# Patient Record
Sex: Female | Born: 1985 | Hispanic: Yes | Marital: Single | Smoking: Never smoker
Health system: Southern US, Community
[De-identification: ages and names within clinical notes are randomized; demographics above are authoritative.]

---

## 2010-10-01 ENCOUNTER — Emergency Department: Payer: Self-pay | Admitting: Emergency Medicine

## 2011-08-22 ENCOUNTER — Ambulatory Visit: Payer: Self-pay | Admitting: Family Medicine

## 2015-03-08 LAB — OB RESULTS CONSOLE VARICELLA ZOSTER ANTIBODY, IGG: Varicella: IMMUNE

## 2015-09-08 LAB — OB RESULTS CONSOLE RUBELLA ANTIBODY, IGM: Rubella: IMMUNE

## 2015-09-08 LAB — OB RESULTS CONSOLE ANTIBODY SCREEN: ANTIBODY SCREEN: NEGATIVE

## 2015-09-08 LAB — OB RESULTS CONSOLE RPR: RPR: NONREACTIVE

## 2015-09-08 LAB — OB RESULTS CONSOLE ABO/RH: RH Type: POSITIVE

## 2015-09-08 LAB — OB RESULTS CONSOLE HEPATITIS B SURFACE ANTIGEN: HEP B S AG: NEGATIVE

## 2015-09-08 LAB — OB RESULTS CONSOLE HIV ANTIBODY (ROUTINE TESTING): HIV: NONREACTIVE

## 2015-09-08 LAB — OB RESULTS CONSOLE GC/CHLAMYDIA
Chlamydia: NEGATIVE
GC PROBE AMP, GENITAL: NEGATIVE

## 2015-10-13 ENCOUNTER — Other Ambulatory Visit: Payer: Self-pay | Admitting: Family Medicine

## 2015-10-13 DIAGNOSIS — Z3492 Encounter for supervision of normal pregnancy, unspecified, second trimester: Secondary | ICD-10-CM

## 2015-10-19 ENCOUNTER — Ambulatory Visit
Admission: RE | Admit: 2015-10-19 | Discharge: 2015-10-19 | Disposition: A | Discharge status: Home / Self Care | Payer: Medicaid Other | Source: Ambulatory Visit | Attending: Family Medicine | Admitting: Family Medicine

## 2015-10-19 DIAGNOSIS — Z3A21 21 weeks gestation of pregnancy: Secondary | ICD-10-CM | POA: Insufficient documentation

## 2015-10-19 DIAGNOSIS — Z36 Encounter for antenatal screening of mother: Secondary | ICD-10-CM | POA: Diagnosis not present

## 2015-10-19 DIAGNOSIS — Z3492 Encounter for supervision of normal pregnancy, unspecified, second trimester: Secondary | ICD-10-CM

## 2016-02-04 LAB — OB RESULTS CONSOLE GBS: STREP GROUP B AG: NEGATIVE

## 2016-02-21 ENCOUNTER — Other Ambulatory Visit: Payer: Self-pay | Admitting: Obstetrics and Gynecology

## 2016-02-21 NOTE — H&P (Signed)
HISTORY AND PHYSICAL  HISTORY OF PRESENT ILLNESS: Ms. Ariel Allen is a Gravida 1 P0 with  LMP of 05/24/15 and EDD of 02/28/16  And 20 4/7  week ultrasound with EDD of 03/03/16 and  a pregnancy complicated by anemia, urinary frequency, mold renal disease ruled out in 2013 presenting for induction of labor.   She has not been having contractions  and denies leakage of fluid, vaginal bleeding, or decreased fetal movement.    REVIEW OF SYSTEMS: A complete review of systems was performed and was specifically negative for headache, changes in vision, RUQ pain, shortness of breath, chest pain, lower extremity edema and dysuria.   HISTORY:  No past medical history on file.  No past surgical history on file.  No current outpatient prescriptions on file prior to visit.   No current facility-administered medications on file prior to visit.     Allergies not on file  OB History  No data available    Gynecologic History: History of Abnormal Pap Smear:  History of STI:  Social History  Substance Use Topics  . Smoking status: Not on file  . Smokeless tobacco: Not on file  . Alcohol Use: Not on file    PHYSICAL EXAM: @VSRANGES @  GENERAL: NAD AAOx3 CHEST:CTAB no increased work of breathing CV:RRR no appreciable murmurs, rubs, gallops ABDOMEN: gravid, nontender, EFWg by [place] EXTREMITIES:  Warm and well-perfused, nontender, nonedematous, DTRsclonus CERVIX: SPECULUM:   FHT:s baseline withvariability  accelerations and decelerations  Toco:   DIAGNOSTIC STUDIES: No results for input(s): WBC, HGB, HCT, PLT, NA, K, CL, CO2, BUN, CREATININE, LABGLOM, GLUCOSE, CALCIUM, BILIDIR, ALKPHOS, AST, ALT, PROT, MG in the last 168 hours.  Invalid input(s): LABALB, UA  PRENATAL STUDIES:  Prenatal Labs:  MBT: O pos, ; Rubella immune, Varicella immune, HIV neg, RPR neg, Hep B neg, GC/CT neg, GBS neg, glucola 121  Last US placenta above the os, AF wnl, normal anatomy  ASSESSMENT  AND PLAN:  1. Fetal Well being  - Fetal Tracing: - Ultrasound: reviewed, as above - Group B Streptococcus: Neg - Presentation:  VTX confirmed by   2. Routine OB: - Prenatal labs reviewed, as above - Rh  O pos  3. Induction of Labor:  -  Contractions external toco in place -  Pelvis proven to -  Plan for induction with  Pitocin per protocol  4. Post Partum Planning: - Infant feeding:  - Contraception:

## 2016-03-06 ENCOUNTER — Inpatient Hospital Stay
Admission: RE | Admit: 2016-03-06 | Discharge: 2016-03-09 | DRG: 766 | Disposition: A | Discharge status: Home / Self Care | Payer: Medicaid Other | Attending: Obstetrics and Gynecology | Admitting: Obstetrics and Gynecology

## 2016-03-06 DIAGNOSIS — Z6836 Body mass index (BMI) 36.0-36.9, adult: Secondary | ICD-10-CM

## 2016-03-06 DIAGNOSIS — O480 Post-term pregnancy: Secondary | ICD-10-CM | POA: Diagnosis present

## 2016-03-06 DIAGNOSIS — Z3A41 41 weeks gestation of pregnancy: Secondary | ICD-10-CM | POA: Diagnosis not present

## 2016-03-06 DIAGNOSIS — O9902 Anemia complicating childbirth: Secondary | ICD-10-CM | POA: Diagnosis present

## 2016-03-06 DIAGNOSIS — O99214 Obesity complicating childbirth: Secondary | ICD-10-CM | POA: Diagnosis present

## 2016-03-06 LAB — COMPREHENSIVE METABOLIC PANEL
ALK PHOS: 161 U/L — AB (ref 38–126)
ALT: 12 U/L — AB (ref 14–54)
AST: 19 U/L (ref 15–41)
Albumin: 3 g/dL — ABNORMAL LOW (ref 3.5–5.0)
Anion gap: 7 (ref 5–15)
BUN: 19 mg/dL (ref 6–20)
CALCIUM: 8.9 mg/dL (ref 8.9–10.3)
CHLORIDE: 108 mmol/L (ref 101–111)
CO2: 18 mmol/L — AB (ref 22–32)
CREATININE: 0.94 mg/dL (ref 0.44–1.00)
GFR calc Af Amer: >60 mL/min (ref >60)
GFR calc non Af Amer: >60 mL/min (ref >60)
GLUCOSE: 88 mg/dL (ref 65–99)
Potassium: 4.3 mmol/L (ref 3.5–5.1)
SODIUM: 133 mmol/L — AB (ref 135–145)
Total Bilirubin: 0.2 mg/dL — ABNORMAL LOW (ref 0.3–1.2)
Total Protein: 6.6 g/dL (ref 6.5–8.1)

## 2016-03-06 LAB — TYPE AND SCREEN
ABO/RH(D): O POS
Antibody Screen: NEGATIVE

## 2016-03-06 LAB — CBC WITH DIFFERENTIAL/PLATELET
BASOS ABS: 0 10*3/uL (ref 0–0.1)
Basophils Relative: 0 %
EOS ABS: 0.1 10*3/uL (ref 0–0.7)
HCT: 34.4 % — ABNORMAL LOW (ref 35.0–47.0)
HEMOGLOBIN: 11.6 g/dL — AB (ref 12.0–16.0)
LYMPHS ABS: 1.1 10*3/uL (ref 1.0–3.6)
MCH: 29.6 pg (ref 26.0–34.0)
MCHC: 33.9 g/dL (ref 32.0–36.0)
MCV: 87.5 fL (ref 80.0–100.0)
Monocytes Absolute: 0.9 10*3/uL (ref 0.2–0.9)
Monocytes Relative: 11 %
Neutro Abs: 5.7 10*3/uL (ref 1.4–6.5)
PLATELETS: 200 10*3/uL (ref 150–440)
RBC: 3.93 MIL/uL (ref 3.80–5.20)
RDW: 14.6 % — ABNORMAL HIGH (ref 11.5–14.5)
WBC: 7.8 10*3/uL (ref 3.6–11.0)

## 2016-03-06 LAB — PROTEIN / CREATININE RATIO, URINE
Creatinine, Urine: 35 mg/dL
Protein Creatinine Ratio: 0.31 mg/mg{Cre} — ABNORMAL HIGH (ref 0.00–0.15)
Total Protein, Urine: 11 mg/dL

## 2016-03-06 LAB — URIC ACID: Uric Acid, Serum: 7.1 mg/dL — ABNORMAL HIGH (ref 2.3–6.6)

## 2016-03-06 LAB — ABO/RH: ABO/RH(D): O POS

## 2016-03-06 MED ORDER — LIDOCAINE HCL (PF) 1 % IJ SOLN: 30.0000 mL | INTRAMUSCULAR | Status: DC | PRN

## 2016-03-06 MED ORDER — OXYCODONE-ACETAMINOPHEN 5-325 MG PO TABS: 1.0000 | ORAL_TABLET | ORAL | Status: DC | PRN

## 2016-03-06 MED ORDER — MISOPROSTOL 25 MCG QUARTER TABLET
25.0000 ug | ORAL_TABLET | ORAL | Status: DC
  Administered 2016-03-06 (×2): 25 ug via VAGINAL
  Filled 2016-03-06 (×8): qty 1

## 2016-03-06 MED ORDER — LACTATED RINGERS IV SOLN
500.0000 mL | INTRAVENOUS | Status: DC | PRN
  Administered 2016-03-06: 1000 mL via INTRAVENOUS

## 2016-03-06 MED ORDER — LACTATED RINGERS IV SOLN
INTRAVENOUS | Status: DC
  Administered 2016-03-06 – 2016-03-07 (×4): via INTRAVENOUS

## 2016-03-06 MED ORDER — LACTATED RINGERS IV SOLN: 500.0000 mL | INTRAVENOUS | Status: DC | PRN

## 2016-03-06 MED ORDER — OXYTOCIN 40 UNITS IN LACTATED RINGERS INFUSION - SIMPLE MED: 1.0000 m[IU]/min | INTRAVENOUS | Status: DC

## 2016-03-06 MED ORDER — OXYTOCIN 40 UNITS IN LACTATED RINGERS INFUSION - SIMPLE MED: 2.5000 [IU]/h | INTRAVENOUS | Status: DC

## 2016-03-06 MED ORDER — CITRIC ACID-SODIUM CITRATE 334-500 MG/5ML PO SOLN
30.0000 mL | ORAL | Status: DC | PRN
  Administered 2016-03-07: 30 mL via ORAL
  Filled 2016-03-06: qty 15

## 2016-03-06 MED ORDER — ONDANSETRON HCL 4 MG/2ML IJ SOLN: 4.0000 mg | Freq: Four times a day (QID) | INTRAMUSCULAR | Status: DC | PRN

## 2016-03-06 MED ORDER — TERBUTALINE SULFATE 1 MG/ML IJ SOLN: 0.2500 mg | Freq: Once | INTRAMUSCULAR | Status: DC | PRN

## 2016-03-06 MED ORDER — OXYTOCIN BOLUS FROM INFUSION: 500.0000 mL | INTRAVENOUS | Status: DC

## 2016-03-06 MED ORDER — ACETAMINOPHEN 325 MG PO TABS: 650.0000 mg | ORAL_TABLET | ORAL | Status: DC | PRN

## 2016-03-06 MED ORDER — BUTORPHANOL TARTRATE 1 MG/ML IJ SOLN: 1.0000 mg | INTRAMUSCULAR | Status: DC | PRN

## 2016-03-06 MED ORDER — OXYCODONE-ACETAMINOPHEN 5-325 MG PO TABS: 2.0000 | ORAL_TABLET | ORAL | Status: DC | PRN

## 2016-03-06 MED ORDER — LIDOCAINE HCL (PF) 1 % IJ SOLN
30.0000 mL | INTRAMUSCULAR | Status: DC | PRN
  Filled 2016-03-06: qty 30

## 2016-03-06 MED ORDER — LACTATED RINGERS IV SOLN: INTRAVENOUS | Status: DC

## 2016-03-06 MED ORDER — OXYTOCIN 40 UNITS IN LACTATED RINGERS INFUSION - SIMPLE MED
2.5000 [IU]/h | INTRAVENOUS | Status: DC
  Administered 2016-03-07: 2.5 [IU]/h via INTRAVENOUS
  Filled 2016-03-06 (×2): qty 1000

## 2016-03-06 MED ORDER — CITRIC ACID-SODIUM CITRATE 334-500 MG/5ML PO SOLN: 30.0000 mL | ORAL | Status: DC | PRN

## 2016-03-06 NOTE — Progress Notes (Addendum)
S:  Feeling more contractions - but tolerating them      Cytotec placed at 1027   O:  VS: Blood pressure 118/76, pulse 75, temperature 98.3 F (36.8 C), temperature source Oral, resp. rate 18, height  (1.499 m), weight 81.647 kg (180 lb), last menstrual period 05/24/2015.        FHR : baseline 135 bpm / variability minimal-moderate/ accelerations + / occasional variable decelerations        Toco: contractions every 5-57minutes / mild        Cervix : Dilation: 1 Effacement (%): 50 Cervical Position: Posterior Station: -3 Exam by:: Joelene Millin, CNM        Membranes:Intact Results for orders placed or performed during the hospital encounter of 03/06/16 (from the past 24 hour(s))  Type and screen     Status: None   Collection Time: 03/06/16  9:52 AM  Result Value Ref Range   ABO/RH(D) O POS    Antibody Screen NEG    Sample Expiration 03/09/2016   Protein / creatinine ratio, urine     Status: Abnormal   Collection Time: 03/06/16  9:53 AM  Result Value Ref Range   Creatinine, Urine 35 mg/dL   Total Protein, Urine 11 mg/dL   Protein Creatinine Ratio 0.31 (H) 0.00 - 0.15 mg/mg[Cre]  ABO/Rh     Status: None   Collection Time: 03/06/16  9:53 AM  Result Value Ref Range   ABO/RH(D) O POS   CBC with Differential/Platelet     Status: Abnormal   Collection Time: 03/06/16  9:54 AM  Result Value Ref Range   WBC 7.8 3.6 - 11.0 K/uL   RBC 3.93 3.80 - 5.20 MIL/uL   Hemoglobin 11.6 (L) 12.0 - 16.0 g/dL   HCT 16.1 (L) 09.6 - 04.5 %   MCV 87.5 80.0 - 100.0 fL   MCH 29.6 26.0 - 34.0 pg   MCHC 33.9 32.0 - 36.0 g/dL   RDW 40.9 (H) 81.1 - 91.4 %   Platelets 200 150 - 440 K/uL   Neutrophils Relative % 74% %   Neutro Abs 5.7 1.4 - 6.5 K/uL   Lymphocytes Relative 14% %   Lymphs Abs 1.1 1.0 - 3.6 K/uL   Monocytes Relative 11% %   Monocytes Absolute 0.9 0.2 - 0.9 K/uL   Eosinophils Relative 1% %   Eosinophils Absolute 0.1 0 - 0.7 K/uL   Basophils Relative 0% %   Basophils Absolute 0.0 0 - 0.1  K/uL  Comprehensive metabolic panel     Status: Abnormal   Collection Time: 03/06/16  9:54 AM  Result Value Ref Range   Sodium 133 (L) 135 - 145 mmol/L   Potassium 4.3 3.5 - 5.1 mmol/L   Chloride 108 101 - 111 mmol/L   CO2 18 (L) 22 - 32 mmol/L   Glucose, Bld 88 65 - 99 mg/dL   BUN 19 6 - 20 mg/dL   Creatinine, Ser 7.82 0.44 - 1.00 mg/dL   Calcium 8.9 8.9 - 95.6 mg/dL   Total Protein 6.6 6.5 - 8.1 g/dL   Albumin 3.0 (L) 3.5 - 5.0 g/dL   AST 19 15 - 41 U/L   ALT 12 (L) 14 - 54 U/L   Alkaline Phosphatase 161 (H) 38 - 126 U/L   Total Bilirubin 0.2 (L) 0.3 - 1.2 mg/dL   GFR calc non Af Amer >60 >60 mL/min   GFR calc Af Amer >60 >60 mL/min   Anion gap 7 5 -  15  Uric acid     Status: Abnormal   Collection Time: 03/06/16  9:54 AM  Result Value Ref Range   Uric Acid, Serum 7.1 (H) 2.3 - 6.6 mg/dL   A: Latent labor     FHR category 2  P: Recheck cervix at 1427 to evaluate for management      Elevated protein-creatinine ratio and uric acid - normal liver enzymes, platelets - will continue to monitor blood pressures for pre-eclampsia      Anticipate NSVD  Ariel Allen, CNM

## 2016-03-06 NOTE — H&P (Signed)
HISTORY AND PHYSICAL  HISTORY OF PRESENT ILLNESS: Ms. 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   She has not been having contractions and denies leakage of fluid, vaginal bleeding, or decreased fetal movement.    REVIEW OF SYSTEMS: A complete review of systems was performed and was specifically POSITIVE for headache, negative for changes in vision, RUQ pain, shortness of breath, chest pain, lower extremity edema and dysuria.   HISTORY:  No past medical history on file.  No past surgical history on file.  No current outpatient prescriptions on file prior to visit.   No current facility-administered medications on file prior to visit.    Allergies not on file  OB History  No data available    Gynecologic History:   Social History  Substance Use Topics  . Smoking status: Not on file  . Smokeless tobacco: Not on file  . Alcohol Use: Not on file    PHYSICAL EXAM: Blood pressure 130/90, pulse 81, temperature 98.3 F (36.8 C), temperature source Oral, resp. rate 18, height 4\' 11"  (1.499 m), weight 81.647 kg (180 lb), last menstrual period 05/24/2015. GENERAL: NAD AAOx3 CHEST:CTAB no increased work of breathing CV:RRR no appreciable murmurs, rubs, gallops ABDOMEN: gravid, nontender, EXTREMITIES: Warm and well-perfused, nontender, nonedematous, normal DTRs no clonus CERVIX: Dilation: 1 Effacement (%): 50 Cervical Position: Posterior Station: -3 Exam by:: Joelene MillinM. Sigmon, CNM   FHTs: baseline: 140 bmp/ Minimal -Moderate variability/ +accels after cervical exam/ 2 late decelerations on admission   Toco: occasional UC  DIAGNOSTIC STUDIES:  Last Labs     No results for input(s): WBC, HGB, HCT, PLT, NA, K, CL, CO2, BUN, CREATININE,  LABGLOM, GLUCOSE, CALCIUM, BILIDIR, ALKPHOS, AST, ALT, PROT, MG in the last 168 hours.  Invalid input(s): LABALB, UA    PRENATAL STUDIES:  Prenatal Labs:  MBT: O pos, ; Rubella immune, Varicella immune, HIV neg, RPR neg, Hep B neg, GC/CT neg, GBS neg, glucola 121  Last US placenta above the os, AF wnl, normal anatomy  ASSESSMENT AND PLAN:  1. Fetal Well being  - Fetal Tracing: Category 2 with progression to Category 1 - Continuous fetal monitoring  - Group B Streptococcus: Neg - Presentation: VTX confirmed by CNM  2. Routine OB: - Prenatal labs reviewed, as above - Rh O pos   3. Induction of Labor:  - Contractions external toco in place - Plan for induction withCytotec 25mcg x 1  4. Post Partum Planning: - Infant feeding: Breast - Contraception: unknown  5. Elevated BPs and Headache: - Pre-eclampsia labs drawn for elevated BPs and Headache -Continue blood pressure monitoring every hour      Dr. Dalbert GarnetBeasley notified of admission and aware of plan of care  Carlean JewsMeredith Sigmon, CNM

## 2016-03-07 ENCOUNTER — Encounter: Payer: Self-pay | Admitting: Anesthesiology

## 2016-03-07 ENCOUNTER — Inpatient Hospital Stay: Payer: Medicaid Other | Admitting: Anesthesiology

## 2016-03-07 ENCOUNTER — Encounter
Admission: RE | Disposition: A | Discharge status: Home / Self Care | Payer: Self-pay | Source: Home / Self Care | Attending: Obstetrics and Gynecology

## 2016-03-07 LAB — RPR: RPR Ser Ql: NONREACTIVE

## 2016-03-07 SURGERY — Surgical Case
Anesthesia: Epidural

## 2016-03-07 MED ORDER — IBUPROFEN 600 MG PO TABS
600.0000 mg | ORAL_TABLET | Freq: Four times a day (QID) | ORAL | Status: DC
Start: 2016-03-07 — End: 2016-03-09
  Administered 2016-03-08 – 2016-03-09 (×3): 600 mg via ORAL
  Filled 2016-03-07 (×3): qty 1

## 2016-03-07 MED ORDER — PRENATAL MULTIVITAMIN CH
1.0000 | ORAL_TABLET | Freq: Every day | ORAL | Status: DC
  Administered 2016-03-08: 1 via ORAL
  Filled 2016-03-07: qty 1

## 2016-03-07 MED ORDER — DIPHENHYDRAMINE HCL 50 MG/ML IJ SOLN: 12.5000 mg | INTRAMUSCULAR | Status: DC | PRN

## 2016-03-07 MED ORDER — SIMETHICONE 80 MG PO CHEW
80.0000 mg | CHEWABLE_TABLET | ORAL | Status: DC | PRN
Start: 2016-03-07 — End: 2016-03-09

## 2016-03-07 MED ORDER — FLEET ENEMA 7-19 GM/118ML RE ENEM: 1.0000 | ENEMA | Freq: Every day | RECTAL | Status: DC | PRN

## 2016-03-07 MED ORDER — NALOXONE HCL 0.4 MG/ML IJ SOLN: 0.4000 mg | INTRAMUSCULAR | Status: DC | PRN

## 2016-03-07 MED ORDER — DIPHENHYDRAMINE HCL 25 MG PO CAPS: 25.0000 mg | ORAL_CAPSULE | Freq: Four times a day (QID) | ORAL | Status: DC | PRN

## 2016-03-07 MED ORDER — LIDOCAINE-EPINEPHRINE (PF) 1.5 %-1:200000 IJ SOLN
INTRAMUSCULAR | Status: DC | PRN
  Administered 2016-03-07: 3 mL via EPIDURAL

## 2016-03-07 MED ORDER — BUPIVACAINE HCL (PF) 0.25 % IJ SOLN
INTRAMUSCULAR | Status: DC | PRN
  Administered 2016-03-07: 5 mL via EPIDURAL

## 2016-03-07 MED ORDER — OXYCODONE HCL 5 MG PO TABS
5.0000 mg | ORAL_TABLET | Freq: Once | ORAL | Status: AC | PRN
  Administered 2016-03-07: 5 mg via ORAL

## 2016-03-07 MED ORDER — KETOROLAC TROMETHAMINE 30 MG/ML IJ SOLN: 30.0000 mg | Freq: Four times a day (QID) | INTRAMUSCULAR | Status: AC | PRN

## 2016-03-07 MED ORDER — BUPIVACAINE HCL (PF) 0.5 % IJ SOLN
INTRAMUSCULAR | Status: DC | PRN
  Administered 2016-03-07: 10 mL

## 2016-03-07 MED ORDER — DIBUCAINE 1 % RE OINT: 1.0000 "application " | TOPICAL_OINTMENT | RECTAL | Status: DC | PRN

## 2016-03-07 MED ORDER — MEASLES, MUMPS & RUBELLA VAC ~~LOC~~ INJ
0.5000 mL | INJECTION | Freq: Once | SUBCUTANEOUS | Status: DC
  Filled 2016-03-07: qty 0.5

## 2016-03-07 MED ORDER — BUPIVACAINE HCL (PF) 0.5 % IJ SOLN
INTRAMUSCULAR | Status: AC
  Filled 2016-03-07: qty 30

## 2016-03-07 MED ORDER — SIMETHICONE 80 MG PO CHEW
80.0000 mg | CHEWABLE_TABLET | Freq: Three times a day (TID) | ORAL | Status: DC
  Administered 2016-03-07 – 2016-03-09 (×4): 80 mg via ORAL
  Filled 2016-03-07 (×3): qty 1

## 2016-03-07 MED ORDER — FENTANYL CITRATE (PF) 100 MCG/2ML IJ SOLN
INTRAMUSCULAR | Status: DC | PRN
  Administered 2016-03-07: 50 ug via INTRAVENOUS
  Administered 2016-03-07 (×2): 100 ug via INTRAVENOUS

## 2016-03-07 MED ORDER — NALOXONE HCL 2 MG/2ML IJ SOSY
1.0000 ug/kg/h | PREFILLED_SYRINGE | INTRAMUSCULAR | Status: DC | PRN
  Filled 2016-03-07: qty 2

## 2016-03-07 MED ORDER — CITRIC ACID-SODIUM CITRATE 334-500 MG/5ML PO SOLN: 30.0000 mL | ORAL | Status: DC

## 2016-03-07 MED ORDER — MEPERIDINE HCL 25 MG/ML IJ SOLN: 6.2500 mg | INTRAMUSCULAR | Status: DC | PRN

## 2016-03-07 MED ORDER — OXYCODONE HCL 5 MG/5ML PO SOLN
5.0000 mg | Freq: Once | ORAL | Status: AC | PRN
  Filled 2016-03-07: qty 5

## 2016-03-07 MED ORDER — TERBUTALINE SULFATE 1 MG/ML IJ SOLN: 0.2500 mg | Freq: Once | INTRAMUSCULAR | Status: DC | PRN

## 2016-03-07 MED ORDER — COCONUT OIL OIL
1.0000 | TOPICAL_OIL | Status: DC | PRN
Start: 2016-03-07 — End: 2016-03-09

## 2016-03-07 MED ORDER — CEFAZOLIN SODIUM-DEXTROSE 2-4 GM/100ML-% IV SOLN
2.0000 g | Freq: Once | INTRAVENOUS | Status: AC
  Administered 2016-03-07: 2 g via INTRAVENOUS

## 2016-03-07 MED ORDER — KETOROLAC TROMETHAMINE 30 MG/ML IJ SOLN
30.0000 mg | Freq: Four times a day (QID) | INTRAMUSCULAR | Status: AC | PRN
  Administered 2016-03-07 – 2016-03-08 (×4): 30 mg via INTRAVENOUS
  Filled 2016-03-07 (×4): qty 1

## 2016-03-07 MED ORDER — LIDOCAINE HCL (PF) 1 % IJ SOLN
INTRAMUSCULAR | Status: DC | PRN
  Administered 2016-03-07: 1 mL via INTRADERMAL

## 2016-03-07 MED ORDER — SENNOSIDES-DOCUSATE SODIUM 8.6-50 MG PO TABS
2.0000 | ORAL_TABLET | ORAL | Status: DC
  Administered 2016-03-09: 2 via ORAL
  Filled 2016-03-07: qty 2

## 2016-03-07 MED ORDER — LACTATED RINGERS IV SOLN
INTRAVENOUS | Status: DC
  Administered 2016-03-07: via INTRAVENOUS

## 2016-03-07 MED ORDER — NALBUPHINE HCL 10 MG/ML IJ SOLN: 5.0000 mg | INTRAMUSCULAR | Status: DC | PRN

## 2016-03-07 MED ORDER — LIDOCAINE HCL (PF) 2 % IJ SOLN
INTRAMUSCULAR | Status: DC | PRN
  Administered 2016-03-07: 100 mg via EPIDURAL
  Administered 2016-03-07 (×2): 200 mg via INTRADERMAL
  Administered 2016-03-07 (×3): 100 mg via EPIDURAL

## 2016-03-07 MED ORDER — BISACODYL 10 MG RE SUPP: 10.0000 mg | Freq: Every day | RECTAL | Status: DC | PRN

## 2016-03-07 MED ORDER — SODIUM CHLORIDE 0.9% FLUSH: 3.0000 mL | INTRAVENOUS | Status: DC | PRN

## 2016-03-07 MED ORDER — OXYTOCIN 40 UNITS IN LACTATED RINGERS INFUSION - SIMPLE MED: 1.0000 m[IU]/min | INTRAVENOUS | Status: DC

## 2016-03-07 MED ORDER — NALBUPHINE HCL 10 MG/ML IJ SOLN: 5.0000 mg | Freq: Once | INTRAMUSCULAR | Status: DC | PRN

## 2016-03-07 MED ORDER — SIMETHICONE 80 MG PO CHEW
80.0000 mg | CHEWABLE_TABLET | ORAL | Status: DC
Start: 2016-03-08 — End: 2016-03-09

## 2016-03-07 MED ORDER — ACETAMINOPHEN 325 MG PO TABS: 650.0000 mg | ORAL_TABLET | ORAL | Status: DC | PRN

## 2016-03-07 MED ORDER — OXYTOCIN 40 UNITS IN LACTATED RINGERS INFUSION - SIMPLE MED
2.5000 [IU]/h | INTRAVENOUS | Status: AC
  Filled 2016-03-07: qty 1000

## 2016-03-07 MED ORDER — FENTANYL 2.5 MCG/ML W/ROPIVACAINE 0.2% IN NS 100 ML EPIDURAL INFUSION (ARMC-ANES)
EPIDURAL | Status: AC
  Filled 2016-03-07: qty 100

## 2016-03-07 MED ORDER — TETANUS-DIPHTH-ACELL PERTUSSIS 5-2.5-18.5 LF-MCG/0.5 IM SUSP: 0.5000 mL | Freq: Once | INTRAMUSCULAR | Status: DC

## 2016-03-07 MED ORDER — WITCH HAZEL-GLYCERIN EX PADS: 1.0000 "application " | MEDICATED_PAD | CUTANEOUS | Status: DC | PRN

## 2016-03-07 MED ORDER — OXYTOCIN 40 UNITS IN LACTATED RINGERS INFUSION - SIMPLE MED
INTRAVENOUS | Status: DC | PRN
  Administered 2016-03-07: 600 mL via INTRAVENOUS

## 2016-03-07 MED ORDER — ONDANSETRON HCL 4 MG/2ML IJ SOLN: 4.0000 mg | Freq: Three times a day (TID) | INTRAMUSCULAR | Status: DC | PRN

## 2016-03-07 MED ORDER — FENTANYL CITRATE (PF) 100 MCG/2ML IJ SOLN
25.0000 ug | INTRAMUSCULAR | Status: DC | PRN
  Administered 2016-03-07: 25 ug via INTRAVENOUS
  Administered 2016-03-07 (×3): 50 ug via INTRAVENOUS
  Filled 2016-03-07: qty 2

## 2016-03-07 MED ORDER — LACTATED RINGERS IV SOLN: INTRAVENOUS | Status: DC

## 2016-03-07 MED ORDER — BUPIVACAINE 0.25 % ON-Q PUMP DUAL CATH 400 ML
INJECTION | Status: AC
  Filled 2016-03-07: qty 400

## 2016-03-07 MED ORDER — OXYTOCIN 40 UNITS IN LACTATED RINGERS INFUSION - SIMPLE MED
INTRAVENOUS | Status: AC
  Administered 2016-03-07: 2.5 [IU]/h via INTRAVENOUS
  Filled 2016-03-07: qty 1000

## 2016-03-07 MED ORDER — CEFAZOLIN SODIUM-DEXTROSE 2-4 GM/100ML-% IV SOLN
INTRAVENOUS | Status: AC
  Administered 2016-03-07: 2 g via INTRAVENOUS
  Filled 2016-03-07: qty 100

## 2016-03-07 MED ORDER — MENTHOL 3 MG MT LOZG: 1.0000 | LOZENGE | OROMUCOSAL | Status: DC | PRN

## 2016-03-07 MED ORDER — PHENYLEPHRINE HCL 10 MG/ML IJ SOLN
INTRAMUSCULAR | Status: DC | PRN
  Administered 2016-03-07 (×2): 100 ug via INTRAVENOUS

## 2016-03-07 MED ORDER — DIPHENHYDRAMINE HCL 25 MG PO CAPS: 25.0000 mg | ORAL_CAPSULE | ORAL | Status: DC | PRN

## 2016-03-07 MED ORDER — OXYCODONE HCL 5 MG PO TABS
ORAL_TABLET | ORAL | Status: AC
  Administered 2016-03-07: 5 mg via ORAL
  Filled 2016-03-07: qty 1

## 2016-03-07 MED ORDER — FENTANYL CITRATE (PF) 100 MCG/2ML IJ SOLN
INTRAMUSCULAR | Status: AC
  Administered 2016-03-07: 50 ug via INTRAVENOUS
  Filled 2016-03-07: qty 2

## 2016-03-07 MED ORDER — FENTANYL 2.5 MCG/ML W/ROPIVACAINE 0.2% IN NS 100 ML EPIDURAL INFUSION (ARMC-ANES)
10.0000 mL/h | EPIDURAL | Status: DC
  Administered 2016-03-07 (×2): 10 mL/h via EPIDURAL

## 2016-03-07 MED ORDER — FENTANYL 2.5 MCG/ML W/ROPIVACAINE 0.2% IN NS 100 ML EPIDURAL INFUSION (ARMC-ANES)
EPIDURAL | Status: AC
  Administered 2016-03-07: 10 mL/h via EPIDURAL
  Filled 2016-03-07: qty 100

## 2016-03-07 MED ORDER — OXYCODONE HCL 5 MG PO TABS
5.0000 mg | ORAL_TABLET | Freq: Once | ORAL | Status: AC
  Administered 2016-03-07: 5 mg via ORAL
  Filled 2016-03-07: qty 1

## 2016-03-07 SURGICAL SUPPLY — 25 items
BARRIER ADHS 3X4 INTERCEED (GAUZE/BANDAGES/DRESSINGS) ×3 IMPLANT
CANISTER SUCT 3000ML (MISCELLANEOUS) ×3 IMPLANT
CATH KIT ON-Q SILVERSOAK 5IN (CATHETERS) ×6 IMPLANT
CHLORAPREP W/TINT 26ML (MISCELLANEOUS) ×6 IMPLANT
DRSG TELFA 3X8 NADH (GAUZE/BANDAGES/DRESSINGS) ×3 IMPLANT
ELECT REM PT RETURN 9FT ADLT (ELECTROSURGICAL) ×3
ELECTRODE REM PT RTRN 9FT ADLT (ELECTROSURGICAL) ×1 IMPLANT
GAUZE SPONGE 4X4 12PLY STRL (GAUZE/BANDAGES/DRESSINGS) ×3 IMPLANT
GOWN STRL REUS W/ TWL LRG LVL3 (GOWN DISPOSABLE) ×3 IMPLANT
GOWN STRL REUS W/TWL LRG LVL3 (GOWN DISPOSABLE) ×6
NS IRRIG 1000ML POUR BTL (IV SOLUTION) ×3 IMPLANT
PAD OB MATERNITY 4.3X12.25 (PERSONAL CARE ITEMS) ×3 IMPLANT
PAD PREP 24X41 OB/GYN DISP (PERSONAL CARE ITEMS) ×3 IMPLANT
RTRCTR C-SECT PINK 25CM LRG (MISCELLANEOUS) ×3 IMPLANT
SPONGE LAP 18X18 5 PK (GAUZE/BANDAGES/DRESSINGS) ×3 IMPLANT
SUT MNCRL 4-0 (SUTURE) ×2
SUT MNCRL 4-0 27XMFL (SUTURE) ×1
SUT PDS AB 1 TP1 96 (SUTURE) IMPLANT
SUT PLAIN 2 0 XLH (SUTURE) IMPLANT
SUT PLAIN GUT 2-0 30 C14 SG823 (SUTURE)
SUT VIC AB 0 CT1 36 (SUTURE) ×15 IMPLANT
SUT VIC AB 3-0 SH 27 (SUTURE) ×4
SUT VIC AB 3-0 SH 27X BRD (SUTURE) ×2 IMPLANT
SUTURE MNCRL 4-0 27XMF (SUTURE) ×1 IMPLANT
SUTURE PLN GUT2-0 30 C14 SG823 (SUTURE) IMPLANT

## 2016-03-07 NOTE — Transfer of Care (Signed)
Immediate Anesthesia Transfer of Care Note  Patient: Ariel Allen  Procedure(s) Performed: Procedure(s): CESAREAN SECTION (N/A)  Patient Location: [facility]  Anesthesia Type:Epidural  Level of Consciousness: awake  Airway & Oxygen Therapy: Patient Spontanous Breathing  Post-op Assessment: Report given to RN  Post vital signs: Reviewed  Last Vitals:  Filed Vitals:   03/07/16 0600 03/07/16 0804  BP: 126/77 137/103  Pulse: 101 92  Temp:  37.3 C  Resp:  18    Last Pain:  Filed Vitals:   03/07/16 0804  PainSc: 0-No pain         Complications: No apparent anesthesia complications

## 2016-03-07 NOTE — Progress Notes (Addendum)
S:  Called by RN to evaluate possible persistent late variable decelerations and inability to palpate and trace uterine contractions - she has been unable to start Pitocin due to strip     Has tried left and right lateral position changes and oxygen by facemask and IV fluid bolus x2   O:  VS: Blood pressure 101/65, pulse 96, temperature 98.2 F (36.8 C), temperature source Oral, resp. rate 18, height 4\' 11"  (1.499 m), weight 81.647 kg (180 lb), last menstrual period 05/24/2015, SpO2 97 %.        FHR : baseline 145 bpm / variability minimal to moderate / accelerations occasional / late decelerations        Toco: contractions every 4.5-6 minutes / Moderate / MVU 195        Cervix : Dilation: 5.5 Effacement (%): 90 Cervical Position: Middle Station: -1 Presentation: Vertex Exam by:: M. Kashara Blocher, CNM         Large caput noted at zero station         Membranes: AROM - clear fluid with bloody show  A: Latent labor     FHR category 2  P: IUPC placed      Attempted FSE, but was dislodged d/t excessive thick cervical mucus - tracing fetal heart rate well externally      Will continue close fetal monitor strip and will notify Dr. Dalbert GarnetBeasley - after discussing with Dr. Dalbert GarnetBeasley and she reviewed strip we are going to proceed with urgent primary cesarean delivery      I discussed plan of care with patient and husband including possible cesarean delivery and they are in agreement  Carlean JewsMeredith Lenore Moyano, CNM

## 2016-03-07 NOTE — Progress Notes (Addendum)
Late entry  S: Pt. Starting to feel more uncomfortable with contractions      Discussed AROM with patient and she agrees   O:  VS: Blood pressure 126/77, pulse 101, temperature 98.2 F (36.8 C), temperature source Oral, resp. rate 18, height 4\' 11"  (1.499 m), weight 81.647 kg (180 lb), last menstrual period 05/24/2015, SpO2 97 %.        FHR : baseline 145 bpm / variability Moderate / accelerations + / no decelerations        Toco: contractions are irregular, mild        Cervix : 3cm/80%/-1/vtx        Bedside ultrasound performed to confirm vtx position - verified by Dr. Dalbert GarnetBeasley         Membranes: AROM at 2309 - clear fluid with bloody show  A: Latent labor     FHR category 1  P: Begin Pitocin 1 milliunit and increase by 2 milliunits       May have epidural upon request      Anticipate NSVD  Carlean JewsMeredith Sigmon, CNM

## 2016-03-07 NOTE — Progress Notes (Signed)
Late entry:  S:  Called by RN with cervical exam after 1 dose of Cytotec 25mcg vaginallly     Patient is still 1cm/50%/-3/vtx  O:  VS: Blood pressure 126/77, pulse 101, temperature 98.2 F (36.8 C), temperature source Oral, resp. rate 18, height 4\' 11"  (1.499 m), weight 81.647 kg (180 lb), last menstrual period 05/24/2015, SpO2 97 %.        FHR : baseline145 bpm / variability Moderate / accelerations + / no decelerations        Toco: contractions are irregular / mild        Cervix :         Membranes: Intact  A: Latent labor     FHR category 1  P: Proceed with another dose of Cytotec 25mcg x1    Will reassess in 4 hours      Anticipate NSVD  Carlean JewsMeredith Sigmon, CNM

## 2016-03-07 NOTE — Progress Notes (Signed)
Patient ID: Ariel Allen, female   DOB: [birth year], 30 y.o.   MRN: [id number]  29yo G1P0 now at 41+[redacted]wks EGA admitted for late term induction  Received call to review FHT - Cat II strip with recurrent late decels noted. Unresponsive to conservative measures, including maternal repositioning, O2, IVF bolus. No pitocin was started and she is not having tachysystole. Currently minimal variability with lates persistent x several hours.   Decision made to proceed to expedited C/S delivery. CNM at bedside with patient in agreement.  The risks of cesarean section discussed with the patient included but were not limited to: bleeding which may require transfusion or reoperation; infection which may require antibiotics; injury to bowel, bladder, ureters or other surrounding organs; injury to the fetus; need for additional procedures including hysterectomy in the event of a life-threatening hemorrhage; placental abnormalities wth subsequent pregnancies, incisional problems, thromboembolic phenomenon and other postoperative/anesthesia complications. The patient concurred with the proposed plan, giving informed written consent for the procedure.   Patient has been NPO since yesterday she will remain NPO for procedure. Anesthesia and OR aware. Preoperative prophylactic antibiotics and SCDs ordered on call to the OR.  To OR when ready.

## 2016-03-07 NOTE — Anesthesia Procedure Notes (Signed)
Epidural Patient location during procedure: OB Start time: 03/07/2016 12:11 AM End time: 03/07/2016 12:13 AM  Staffing Anesthesiologist: Margorie JohnPISCITELLO, JOSEPH K Performed by: anesthesiologist   Preanesthetic Checklist Completed: patient identified, site marked, surgical consent, pre-op evaluation, timeout performed, IV checked, risks and benefits discussed and monitors and equipment checked  Epidural Patient position: sitting Prep: Betadine Patient monitoring: heart rate, continuous pulse ox and blood pressure Approach: midline Location: L4-L5 Injection technique: LOR saline  Needle:  Needle type: Tuohy  Needle gauge: 17 G Needle length: 9 cm and 9 Needle insertion depth: 8 cm Catheter type: closed end flexible Catheter size: 19 Gauge Catheter at skin depth: 14 cm Test dose: negative and 1.5% lidocaine with Epi 1:200 K  Assessment Sensory level: T10 Events: blood not aspirated, injection not painful, no injection resistance, negative IV test and no paresthesia  Additional Notes Patient identified. Risks/Benefits/Options discussed with patient including but not limited to bleeding, infection, nerve damage, paralysis, failed block, incomplete pain control, headache, blood pressure changes, nausea, vomiting, reactions to medication both or allergic, itching and postpartum back pain. Confirmed with bedside nurse the patient's most recent platelet count. Confirmed with patient that they are not currently taking any anticoagulation, have any bleeding history or any family history of bleeding disorders. Patient expressed understanding and wished to proceed. All questions were answered. Sterile technique was used throughout the entire procedure. Please see nursing notes for vital signs. Test dose was given through epidural catheter and negative prior to continuing to dose epidural or start infusion. Warning signs of high block given to the patient including shortness of breath,  tingling/numbness in hands, complete motor block, or any concerning symptoms with instructions to call for help. Patient was given instructions on fall risk and not to get out of bed. All questions and concerns addressed with instructions to call with any issues or inadequate analgesia.   Patient tolerated the insertion well without immediate complications.Reason for block:procedure for pain

## 2016-03-07 NOTE — Op Note (Addendum)
Cesarean Section Procedure Note  Date of procedure: 03/07/2016   Pre-operative Diagnosis: Intrauterine pregnancy at 8844w1d; persistent Cat II strip with minimal variability and recurrent late decels  Post-operative Diagnosis: same, delivered.  Procedure: Primary Low Transverse Cesarean Section through Pfannenstiel incision  Surgeon: Christeen DouglasBethany Aadi Bordner, MD  Assistant(s):  Carlean JewsMeredith Sigmon, CNM  Anesthesia: Epidural anesthesia  Anesthesiologist: No responsible provider has been recorded for the case. Anesthesiologist: Lezlie OctaveGijsbertus F Van Staveren, MD; Rosaria FerriesJoseph K Piscitello, MD CRNA: Mathews ArgyleBenjamin Logan, CRNA  Estimated Blood Loss:  600ml         Drains: On-Q pump         Total IV Fluids: 2000ml  Urine Output: 800ml         Specimens: Cord blood, cord gas and placenta sent         Complications:  None; patient tolerated the procedure well.         Disposition: PACU - hemodynamically stable.         Condition: stable  Findings:  A female infant "Ariel Allen" in cephalic presentation, wedged in the pelvis Amniotic fluid - Clear  Birth weight 2860 g.  Apgars of 8 and 9 at one and five minutes respectively.  Intact placenta with a three-vessel cord.  Grossly normal uterus, tubes and ovaries bilaterally. No intraabdominal adhesions were noted.  Indications: non-reassuring fetal status  Procedure Details  The patient was taken to Operating Room, identified as the correct patient and the procedure verified as C-Section Delivery. A formal Time Out was held with all team members present and in agreement.  After induction of anesthesia, the patient was draped and prepped in the usual sterile manner. A Pfannenstiel skin incision was made and carried down through the subcutaneous tissue to the fascia. Fascial incision was made and extended transversely with the Mayo scissors. The fascia was separated from the underlying rectus tissue superiorly and inferiorly. The peritoneum was identified and entered  bluntly. Peritoneal incision was extended longitudinally. The utero-vesical peritoneal reflection was incised transversely and a bladder flap was created digitally. The bladder was bulging into the operative field throughout the case, and was displaced with both an Alexis retractor and a bladder blade.  A low transverse hysterotomy was made. The fetus was delivered atraumatically with a vaginal hand required for upward mobility. The umbilical cord was clamped x2 and cut and the infant was handed to the awaiting pediatricians. The placenta was removed intact and appeared normal, intact, and with a 3-vessel cord.   The uterus was exteriorized and cleared of all clot and debris. The hysterotomy was closed with running sutures of 0-Vicryl. A second imbricating layer was placed with the same suture. Excellent hemostasis was observed. The peritoneal cavity was cleared of all clots and debris. The uterus was returned to the abdomen. Interceed was placed over the hysterotomy. An On-Q pump was placed without difficulty.  The pelvis was examined and again, excellent hemostasis was noted. The bladder continued to bulge into the field, and 2 loose sutures of 0-Vicryl were placed to bring the rectus muscles together in the midline to allow for fascial closure; severe of the fascial stiches had to be removed to assure the bladder was intact prior to placing the rectus stitches.The fascia was then reapproximated with running sutures of 0 Vicryl.  The subcutaneous tissue was reapproximated with interrupted sutures of 0-vircyl. The skin was reapproximated with a 4-0 Monocryl subcuticular stitch.   Instrument, sponge, and needle counts were correct prior to the abdominal closure and at  the conclusion of the case.   The patient tolerated the procedure well and was transferred to the recovery room in stable condition.   Christeen Douglas, MD 03/07/2016

## 2016-03-07 NOTE — Anesthesia Preprocedure Evaluation (Signed)
Anesthesia Evaluation  Patient identified by MRN, date of birth, ID band Patient awake    Reviewed: Allergy & Precautions, H&P , NPO status , Patient's Chart, lab work & pertinent test results  Airway Mallampati: III  TM Distance: >3 FB Neck ROM: full    Dental  (+) Poor Dentition   Pulmonary neg pulmonary ROS, neg shortness of breath,    Pulmonary exam normal breath sounds clear to auscultation       Cardiovascular Exercise Tolerance: Good hypertension, Normal cardiovascular exam Rhythm:regular Rate:Normal     Neuro/Psych    GI/Hepatic negative GI ROS,   Endo/Other    Renal/GU   negative genitourinary   Musculoskeletal   Abdominal   Peds  Hematology negative hematology ROS (+)   Anesthesia Other Findings History reviewed. No pertinent past medical history.  History reviewed. No pertinent surgical history.  BMI    Body Mass Index   36.33 kg/m 2      Reproductive/Obstetrics (+) Pregnancy                             Anesthesia Physical Anesthesia Plan  ASA: III  Anesthesia Plan: Epidural   Post-op Pain Management:    Induction:   Airway Management Planned:   Additional Equipment:   Intra-op Plan:   Post-operative Plan:   Informed Consent: I have reviewed the patients History and Physical, chart, labs and discussed the procedure including the risks, benefits and alternatives for the proposed anesthesia with the patient or authorized representative who has indicated his/her understanding and acceptance.     Plan Discussed with: Anesthesiologist  Anesthesia Plan Comments:         Anesthesia Quick Evaluation

## 2016-03-08 LAB — CBC
HCT: 30.3 % — ABNORMAL LOW (ref 35.0–47.0)
Hemoglobin: 10.2 g/dL — ABNORMAL LOW (ref 12.0–16.0)
MCH: 29.5 pg (ref 26.0–34.0)
MCHC: 33.8 g/dL (ref 32.0–36.0)
MCV: 87.3 fL (ref 80.0–100.0)
PLATELETS: 160 10*3/uL (ref 150–440)
RBC: 3.47 MIL/uL — ABNORMAL LOW (ref 3.80–5.20)
RDW: 14.5 % (ref 11.5–14.5)
WBC: 11.2 10*3/uL — ABNORMAL HIGH (ref 3.6–11.0)

## 2016-03-08 MED ORDER — FERROUS SULFATE 325 (65 FE) MG PO TABS
325.0000 mg | ORAL_TABLET | Freq: Two times a day (BID) | ORAL | Status: DC
  Administered 2016-03-08 – 2016-03-09 (×2): 325 mg via ORAL
  Filled 2016-03-08 (×2): qty 1

## 2016-03-08 MED ORDER — OXYCODONE-ACETAMINOPHEN 5-325 MG PO TABS
1.0000 | ORAL_TABLET | Freq: Four times a day (QID) | ORAL | Status: DC | PRN
Start: 2016-03-08 — End: 2016-03-09

## 2016-03-08 NOTE — Anesthesia Postprocedure Evaluation (Signed)
Anesthesia Post Note  Patient: Ariel Allen  Procedure(s) Performed: Procedure(s) (LRB): CESAREAN SECTION (N/A)  Patient location during evaluation: Mother Baby Anesthesia Type: Epidural Level of consciousness: awake, awake and alert and oriented Pain management: pain level controlled Vital Signs Assessment: post-procedure vital signs reviewed and stable Respiratory status: spontaneous breathing, nonlabored ventilation and respiratory function stable Cardiovascular status: blood pressure returned to baseline Postop Assessment: no headache, no backache, no signs of nausea or vomiting and patient able to bend at knees Anesthetic complications: no    Last Vitals:  Filed Vitals:   03/08/16 0429 03/08/16 0556  BP: 108/70 111/64  Pulse: 99 87  Temp: 37.3 C 36.9 C  Resp: 18     Last Pain:  Filed Vitals:   03/08/16 0556  PainSc: 3                  Chiropodisttephanie Shelia Magallon

## 2016-03-08 NOTE — Progress Notes (Signed)
POSTOPERATIVE DAY # 1 S/P Primary LTCS for non-reassuring fetal heart rate and fetal intolerance to labor   S:         Reports feeling okay, still having some pain             Tolerating po intake / no nausea / no vomiting /+flatus             Bleeding is light             Pain controlled withToradol and On-q pump             Up with assistance / ambulatory/ voided x1  Newborn formula feeding     O:  VS: BP 115/56 mmHg  Pulse 77  Temp(Src) 98.5 F (36.9 C) (Oral)  Resp 20  Ht 4\' 11"  (1.499 m)  Wt 81.647 kg (180 lb)  BMI 36.34 kg/m2  SpO2 97%  LMP 05/24/2015  Breastfeeding? Unknown   LABS:               Recent Labs  03/06/16 0954 03/08/16 0452  WBC 7.8 11.2*  HGB 11.6* 10.2*  PLT 200 160               Bloodtype: --/--/O POS (05/22 [id number])  Rubella: Immune (11/23 0000)                                             I&O: Intake/Output      05/23 0701 - 05/24 0700 05/24 0701 - 05/25 0700   I.V. (mL/kg) 600 (7.3)    Total Intake(mL/kg) 600 (7.3)    Urine (mL/kg/hr) 4000 (2)    Blood     Total Output 4000     Net -3400                       Physical Exam:             Alert and Oriented X3  Lungs: Clear and unlabored  Heart: regular rate and rhythm / no mumurs  Abdomen: soft, non-tender, non-distended, On-Q pump no evidence of infection              Fundus: firm, non-tender, U-2             Dressing: honeycomb dressing c/d/i             Incision:  approximated with sutures / no erythema / no ecchymosis / no drainage  Perineum: intact  Lochia: scant  Extremities: no edema, no calf pain or tenderness  A:        POD # 1 S/P Primary LTCS for  non-reassuring fetal heart rate and fetal intolerance to labor            ABL Anemia  P:        Routine postoperative care              Ferrous Sulfate BID  May D/C IV if tolerating PO today   May ambulate in halls today   Ariel Allen, [place]

## 2016-03-09 MED ORDER — OXYCODONE-ACETAMINOPHEN 5-325 MG PO TABS: 1.0000 | ORAL_TABLET | Freq: Four times a day (QID) | ORAL | Status: AC | PRN

## 2016-03-09 MED ORDER — TETANUS-DIPHTH-ACELL PERTUSSIS 5-2.5-18.5 LF-MCG/0.5 IM SUSP: 0.5000 mL | Freq: Once | INTRAMUSCULAR | Status: DC

## 2016-03-09 NOTE — Discharge Summary (Signed)
Obstetric Discharge Summary   Patient ID: Ariel Allen MRN: [id number] DOB/AGE: 1986/02/25 30 y.o.   Date of Admission: 03/06/2016  Date of Discharge: 03/09/16  Admitting Diagnosis: IOL at 4928w1d  Secondary Diagnosis: Obesity, anemia, Mild kidney issues  Mode of Delivery: primary cesarean section 9LTCS)     Discharge Diagnosis: POD#2   Intrapartum Procedures: IOL   Post partum procedures: none  Complications: none   Brief Hospital Course    POD#2(Cesarean Section): Ariel Allen is a G1P1001 who underwent cesarean section on 03/06/2016 - 03/07/2016.  Patient had an uncomplicated surgery; for further details of this surgery, please refer to the operative note.  Patient had an uncomplicated postpartum course.  By time of discharge on POD#2, her pain was controlled on oral pain medications; she had appropriate lochia and was ambulating, voiding without difficulty, tolerating regular diet and passing flatus.   She was deemed stable for discharge to home.    Labs: CBC Latest Ref Rng 03/08/2016 03/06/2016  WBC 3.6 - 11.0 K/uL 11.2(H) 7.8  Hemoglobin 12.0 - 16.0 g/dL 10.2(L) 11.6(L)  Hematocrit 35.0 - 47.0 % 30.3(L) 34.4(L)  Platelets 150 - 440 K/uL 160 200   O POS  Physical exam:  Blood pressure 108/66, pulse 75, temperature 98 F (36.7 C), temperature source Oral, resp. rate 20, height 4\' 11"  (1.499 m), weight 180 lb (81.647 kg), last menstrual period 05/24/2015, SpO2 99 %, unknown if currently breastfeeding. General: alert and no distress Lochia: appropriate Abdomen: soft, NT Uterine Fundus: firm Incision: healing well, no significant drainage, no dehiscence, no significant erythema, On Q pump intact. Honeycomb dressing.  Extremities: No evidence of DVT seen on physical exam. No lower extremity edema.  Discharge Instructions: Per After Visit Summary. Activity: Advance as tolerated. Pelvic rest for 6 weeks.  Also refer to After Visit Summary Diet:  Regular Medications:   Medication List    TAKE these medications        PRENATAL VITAMINS PO  Take by mouth.       Outpatient follow up:  Postpartum contraception: Nexplanon  Discharged Condition: stable  Discharged to: home   Newborn Data:  Baby Boy   Disposition:home with mother  Apgars: APGAR (1 MIN): 8   APGAR (5 MINS): 9   APGAR (10 MINS):    Baby Feeding: Breast  Sharee Pimplearon W Jones, CNM 03/09/2016

## 2016-03-09 NOTE — Discharge Instructions (Signed)
Parto por cesárea °(Cesarean Delivery) °El parto por cesárea es el nacimiento de un bebé a través de un corte (incisión) en el abdomen y la matriz (útero).  °INFORME A SU MÉDICO: °· Todos los medicamentos que utiliza, incluidos vitaminas, hierbas, gotas oftálmicas, cremas y medicamentos de venta libre. °· Problemas previos que usted o los miembros de su familia hayan tenido con el uso de anestésicos. °· Hemorragias o trastornos de la coagulación sanguínea que padezca. °· Antecedentes familiares de coágulos sanguíneos o de trastornos hemorrágicos. °· Antecedentes de trombosis venosa profunda (TVP) o de embolia pulmonar (EP). °· Si tiene cirugías previas. °· Enfermedades que tenga. °· Cualquier alergia que tenga. °· Complicaciones del embarazo. °RIESGOS Y COMPLICACIONES  °En general, se trata de un procedimiento seguro. Sin embargo, como en cualquier procedimiento, pueden surgir complicaciones. Algunas de las complicaciones posibles son las siguientes: °· Hemorragia. °· Infección. °· Coágulos sanguíneos. °· Lesión en los órganos circundantes. °· Problemas con la anestesia. °· Lesión al bebé. °ANTES DEL PROCEDIMIENTO  °· Pueden administrarle un medicamento antiácido. Esto impedirá que los contenidos ácidos del estómago ingresen a los pulmones si vomita durante la cirugía. °· Pueden administrarle antibióticos para prevenir infecciones. °PROCEDIMIENTO  °· Para evitar las infecciones en la incisión: °¨ Pueden rasurarle la zona púbica si hay vellos cerca de la incisión. °¨ Le limpiarán la piel de la zona púbica y de la parte inferior del abdomen con una solución para destruir las bacterias (antiséptico). °· Le colocarán un tubo (sonda de Foley) en la vejiga para drenar la orina desde la vejiga a una bolsa. Esto mantendrá la vejiga vacía durante la cirugía. °· Se le colocará una vía intravenosa (IV) en una de las venas. °· Pueden administrarle un medicamento para adormecer a zona inferior del cuerpo anestesia regional). Si  estuviera en trabajo de parto, podrán administrarle una anestesia epidural, que se utiliza tanto en el trabajo de parto como en la cesárea. Puede ser que le administren un medicamento que la hará dormir (anestesia general), aunque esto no es tan frecuente. °· Les controlarán la frecuencia cardíaca a usted y al bebé. °· Le harán una incisión en el abdomen que se extiende hacia el útero. Hay dos tipos básicos de incisión: °¨ La incisión horizontal (transversal). Las incisiones horizontales se realizan en la mayoría de las cesáreas de rutina. °¨ La incisión vertical. Se realiza desde la parte de arriba del abdomen hasta la parte de abajo y se realiza con menos frecuencia. Por lo general se realiza cuando se presenta una complicación grave (parto prematuro extremo) o en situaciones de emergencia.  °¨ Las incisiones horizontales y verticales pueden utilizarse ambas al mismo tiempo. Sin embargo, es muy poco frecuente. °· Luego se realiza una incisión en el útero para que nazca el bebé. °· El bebé nacerá. °· El médico puede colocarle al bebé sobre el pecho. Es importante mantenerlo abrigado. El médico secará al bebé, lo colocará directamente sobre su piel, y lo cubrirá con mantas secas y abrigadas. °· Ambas incisiones se cerrarán con puntos reabsorbibles. °DESPUÉS DEL PROCEDIMIENTO  °· Si estuvo despierta durante la cirugía, podrá ver al bebé enseguida. Si estuvo dormida durante el procedimiento, verá al bebé tan pronto como despierte. °· Podrá amamantar a su bebé después del procedimiento. °· Podrá levantarse y caminar el mismo día de la cirugía. Si debe permanecer en cama durante cierto tiempo, recibirá ayuda para darse vuelta, toser y respirar profundamente después de la cirugía. Esto ayuda a evitar complicaciones en los pulmones, como la neumonía. °·   No se levante de la cama sola la primera vez luego de la cirugía. Necesitará ayuda para levantarse de la cama hasta que pueda hacerlo sola. °· Podrá darse una ducha el día  siguiente a la cirugía.  Después que le quiten la venda (vendaje) del lugar de la incisión, un enfermero la ayudará a ducharse, en caso de que lo necesite.  °· Es posible que le indiquen que tome medidas para ayudar a evitar la formación de coágulos sanguíneos en las piernas. Estas pueden incluir lo siguiente: °¨ Caminar inmediatamente después de la cirugía, acompañada de una persona que pueda ayudarla. Moverse después de la cirugía ayuda a mejorar la circulación sanguínea. °¨ Usar medias de compresión u otros tipos de dispositivos. °¨ Tomar medicamentos anticoagulantes si corre un alto riesgo de tener TVP o EP. °· Guardar los coágulos de sangre que elimina por la vagina. Si elimina un coágulo cuando va al baño, por favor no tire la cadena. Llame al enfermero. Comuníquele al enfermero si piensa que tiene demasiada hemorragia o que elimina muchos coágulos. °· Le administrarán medicamentos para el dolor y las náuseas, si es necesario. Informe a los médicos si siente dolor. También le indicarán antibióticos para prevenir una infección. °· Le quitarán la vía intravenosa cuando beba una cantidad razonable de líquido. La sonda de Foley se retirará cuando se levante y camine. °· Si su tipo sanguíneo es Rh negativo y el bebé es Rh positivo, le darán una inyección de inmunoglobulina anti-D. Esta inyección evita que tenga problemas con el Rh en embarazos futuros. Deberá colocarse la inyección aún si se ha hecho atar las trompas (ligadura de trompas). °· Si le permiten llevar al bebé a dar un paseo, colóquelo en la cunita y empújela. °  °Esta información no tiene como fin reemplazar el consejo del médico. Asegúrese de hacerle al médico cualquier pregunta que tenga. °  °Document Released: 10/02/2005 Document Revised: 06/23/2015 °Elsevier Interactive Patient Education ©2016 Elsevier Inc. ° °

## 2016-03-09 NOTE — Lactation Note (Signed)
This note was copied from a baby's chart. Lactation Consultation Note  Patient Name: Ariel Allen YNWGN'FToday's Date: 03/09/2016 Reason for consult: Initial assessment   Maternal Data   Mother wants to breast and bottle feed. Feeding     Basic breast feeding was reviewed.                       Consult Status      Trudee GripCarolyn P [name] 03/09/2016, 11:30 AM

## 2016-03-09 NOTE — Progress Notes (Signed)
D/C order from MD.  Reviewed d/c instructions and prescriptions with patient and answered any questions.  Patient d/c home with infant via wheelchair by nursing/auxillary. 

## 2016-05-08 LAB — SURGICAL PATHOLOGY

## 2018-02-21 ENCOUNTER — Other Ambulatory Visit: Payer: Self-pay | Admitting: Family Medicine

## 2018-02-21 DIAGNOSIS — Z3482 Encounter for supervision of other normal pregnancy, second trimester: Secondary | ICD-10-CM

## 2018-03-12 ENCOUNTER — Ambulatory Visit: Payer: Self-pay

## 2018-04-03 ENCOUNTER — Other Ambulatory Visit: Payer: Self-pay | Admitting: Family Medicine

## 2018-04-03 DIAGNOSIS — Z3482 Encounter for supervision of other normal pregnancy, second trimester: Secondary | ICD-10-CM

## 2018-04-10 ENCOUNTER — Ambulatory Visit
Admission: RE | Admit: 2018-04-10 | Discharge: 2018-04-10 | Disposition: A | Discharge status: Home / Self Care | Payer: Medicaid Other | Source: Ambulatory Visit | Attending: Family Medicine | Admitting: Family Medicine

## 2018-04-10 DIAGNOSIS — Z3A22 22 weeks gestation of pregnancy: Secondary | ICD-10-CM | POA: Diagnosis not present

## 2018-04-10 DIAGNOSIS — Z3482 Encounter for supervision of other normal pregnancy, second trimester: Secondary | ICD-10-CM | POA: Insufficient documentation

## 2018-05-16 ENCOUNTER — Other Ambulatory Visit: Payer: Self-pay | Admitting: Family Medicine

## 2018-05-16 DIAGNOSIS — Z3482 Encounter for supervision of other normal pregnancy, second trimester: Secondary | ICD-10-CM

## 2018-05-29 ENCOUNTER — Ambulatory Visit
Admission: RE | Admit: 2018-05-29 | Discharge: 2018-05-29 | Disposition: A | Discharge status: Home / Self Care | Payer: Medicaid Other | Source: Ambulatory Visit | Attending: Family Medicine | Admitting: Family Medicine

## 2018-05-29 DIAGNOSIS — Z3482 Encounter for supervision of other normal pregnancy, second trimester: Secondary | ICD-10-CM | POA: Insufficient documentation

## 2019-05-09 ENCOUNTER — Other Ambulatory Visit: Payer: Self-pay

## 2019-05-09 DIAGNOSIS — Z20822 Contact with and (suspected) exposure to covid-19: Secondary | ICD-10-CM

## 2019-05-12 LAB — NOVEL CORONAVIRUS, NAA: SARS-CoV-2, NAA: DETECTED — AB

## 2019-05-20 ENCOUNTER — Telehealth: Payer: Self-pay

## 2019-05-20 NOTE — Telephone Encounter (Signed)
Attempted to contact pt., via [address], [place], [place] # [phone]) re: positive COVID results.  Husband answered phone.  Pt. was not available.  Advised husband I would need to speak with pt. re: COVID results.  Stated the pt. was at work.  Stated they were contacted by the [facility] and that a nurse called them for follow-up, several times.  Reported the pt. had quarantined a long time, and was feeling fine, and was given the approval by the Health [facility] to return to work.  Informed husband that this call was to make sure pt. Had been contacted by the [facility] Dept[facility] advised of her results, and of guidelines for care.  Husband thanked nurse for the call.

## 2019-11-05 IMAGING — US US OB FOLLOW-UP
2 series · 13 of 28 positions shown · non-contrast
Comparison: none

CLINICAL DATA: Current assigned gestational age of 30 weeks 1 day.
Follow-up incomplete fetal anatomic evaluation and growth.

EXAM:
OBSTETRIC 14+ WK ULTRASOUND FOLLOW-UP

[Series 1: us ob follow-up · 0.25mm/px · 11 of 32 slices shown (1 of 2)]
[im 2/32]
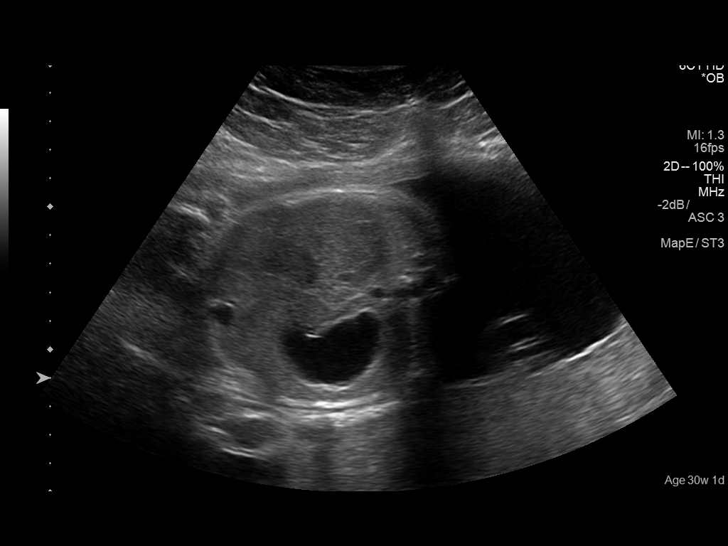
[im 5/32]
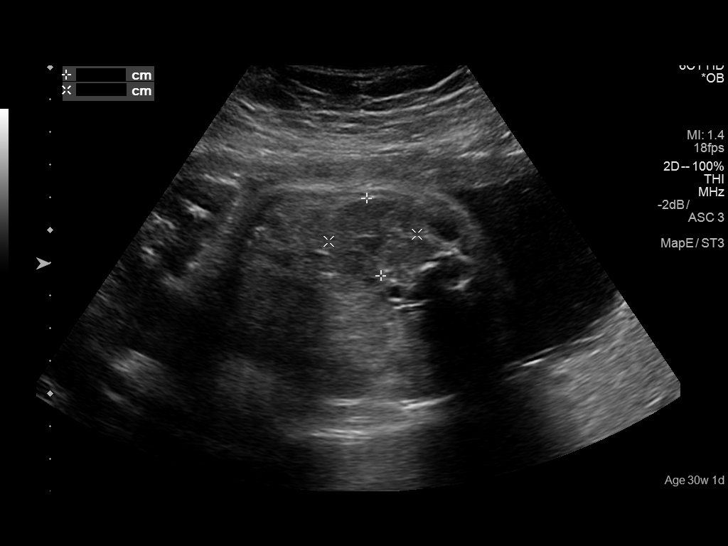
[im 7/32]
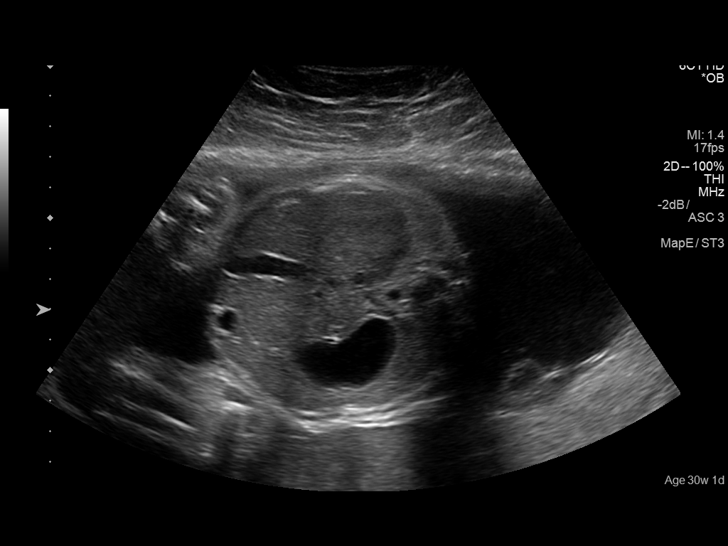
[im 10/32]
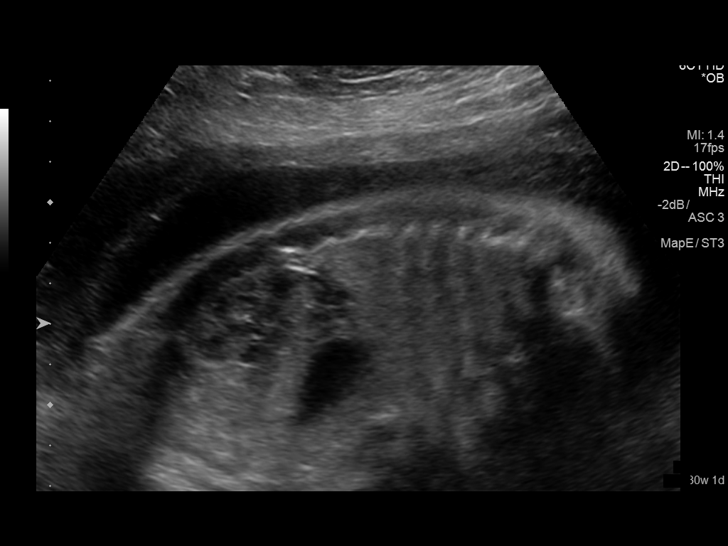
[im 13/32]
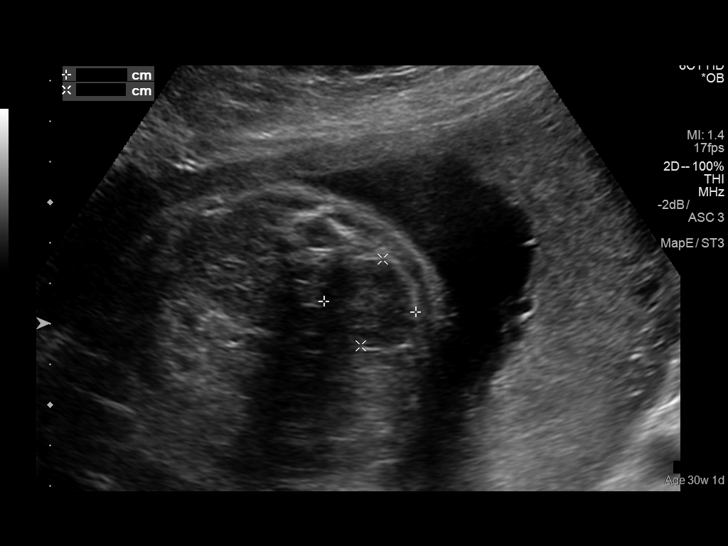
[im 15/32]
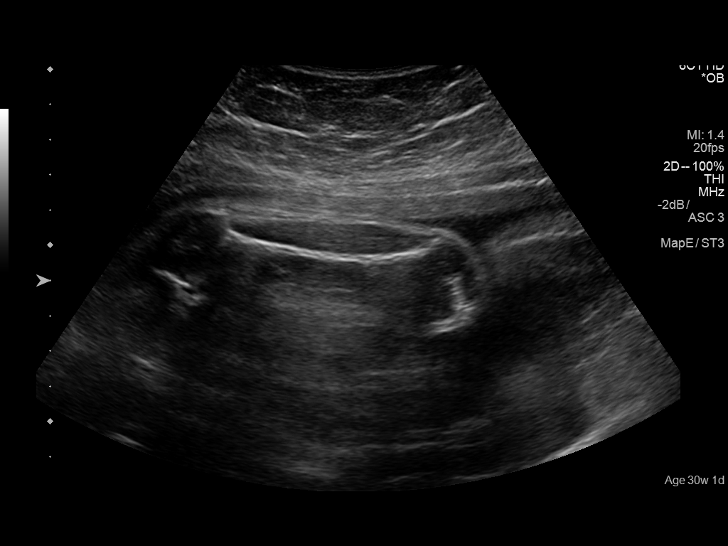
[im 19/32]
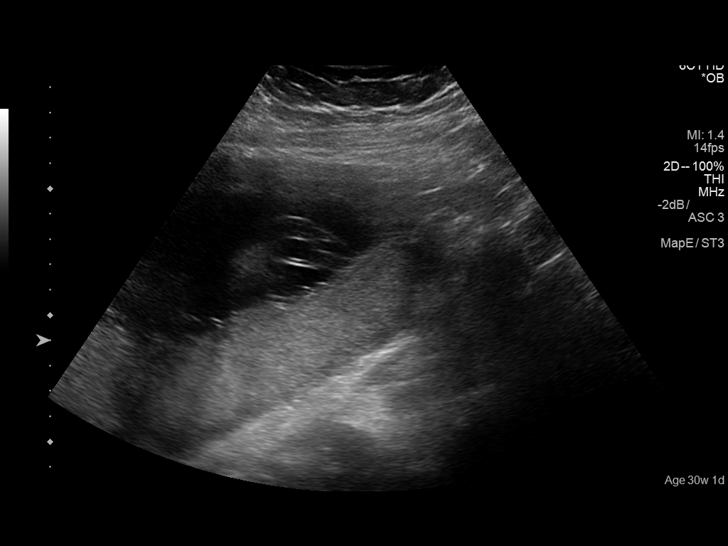
[im 22/32]
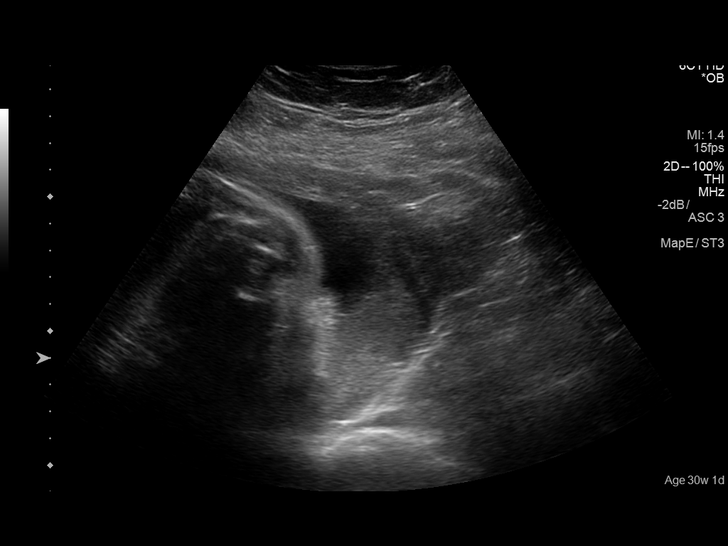
[im 25/32]
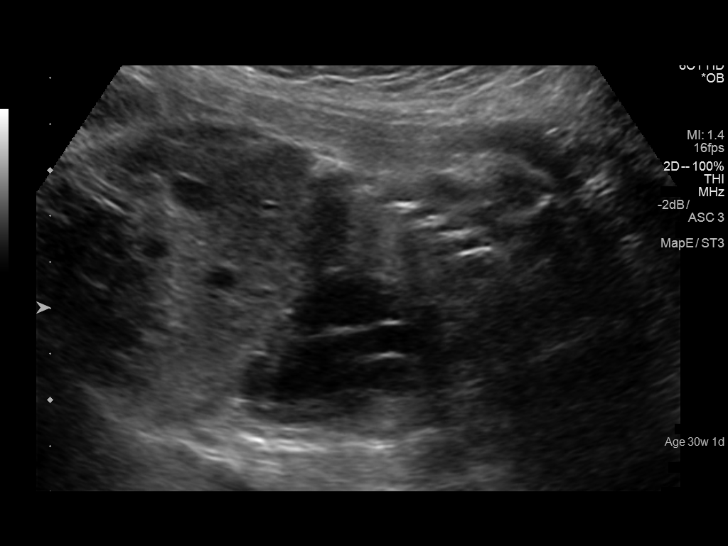
[im 27/32]
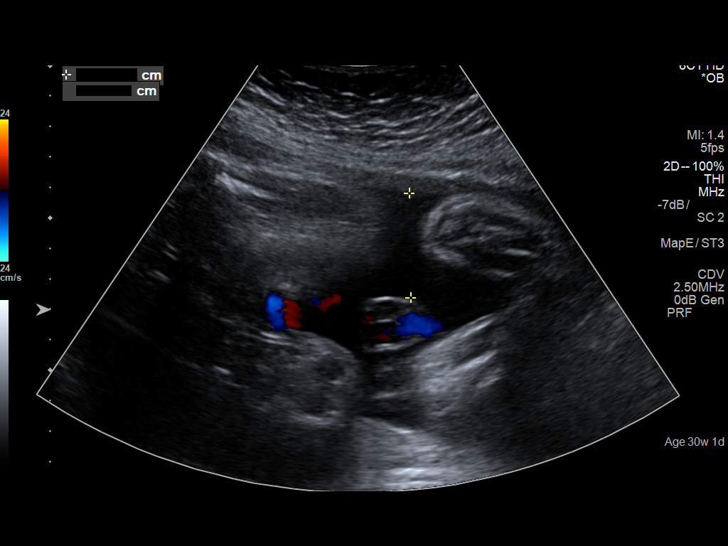
[im 30/32]
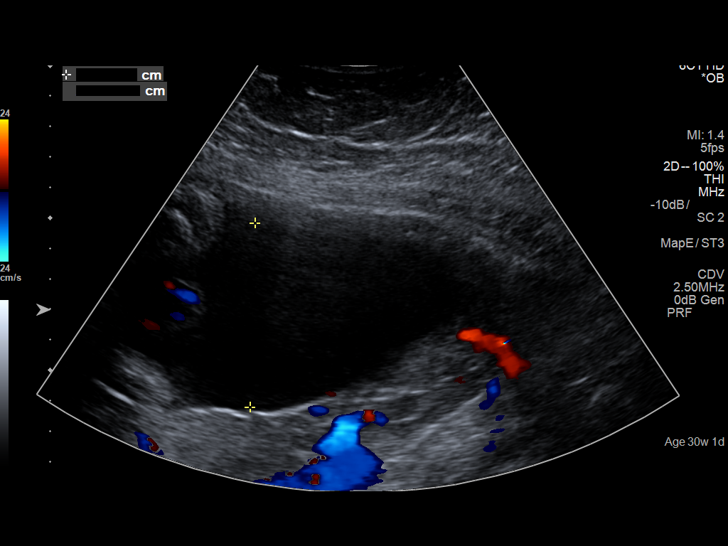

[Series 1001: us ob follow-up · 0.23mm/px · 2 of 5 slices shown (2 of 2)]
[im 1/5]
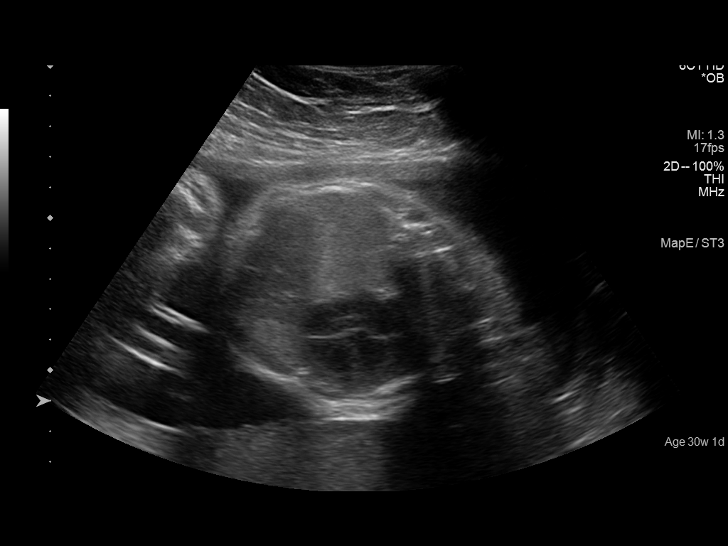
[im 3/5]
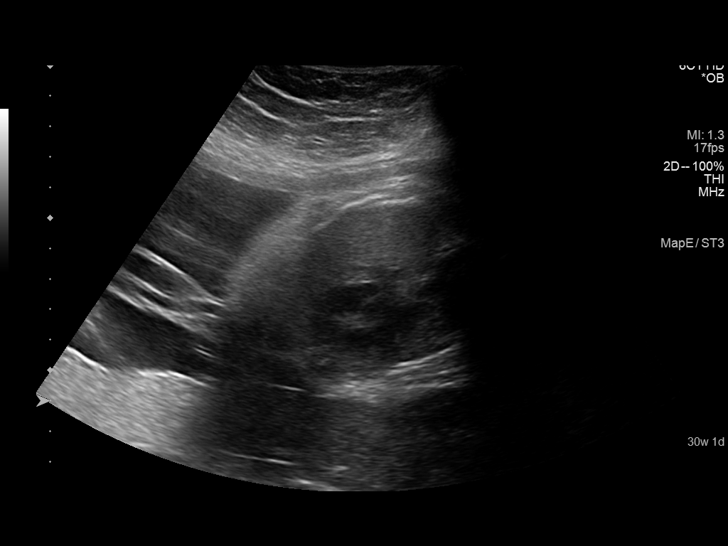

[13 of 28 positions shown; findings below may reference images not displayed]

FINDINGS: Number of Fetuses: 1

Heart Rate:  149 bpm

Movement: Yes

Presentation: Cephalic

Previa: No

Placental Location: Posterior

Amniotic Fluid (Subjective): Within normal limits

Amniotic Fluid (Objective):

AFI 17.0 cm (5%ile= 9.0 cm, 95%= 23.4 cm for 30 wks)

FETAL BIOMETRY

BPD:  7.0cm 28w 0d

HC:    26.9cm 29w 3d

AC:   28.1cm 32w 1d

FL:   5.8cm 30w 3d

Current Mean GA: 30w 0d US EDC: 08/07/2018

Estimated Fetal Weight:  1,675g 67%ile

FETAL ANATOMY

Lateral Ventricles: Previously seen

Thalami/CSP: Appears normal

Posterior Fossa: Previously seen

Upper Lip: Previously seen

Spine: Previously seen

4 Chamber Heart on Left: Appears normal

LVOT: Appears normal

RVOT: Appears normal

Stomach on Left: Appears normal

3 Vessel Cord: Previously seen

Cord Insertion site: Previously seen

Kidneys: Appears normal

Bladder: Appears normal

Extremities: Previously seen

Sex: Previously seen

Technical Limitations: Advanced gestational age and fetal position

Maternal Findings:

Cervix:  4.9 cm transabdominally
IMPRESSION: Single living IUP at 30 weeks 1 day.  Appropriate fetal growth.

Normal visualized fetal anatomy.  No anomalies identified.

## 2020-01-04 ENCOUNTER — Ambulatory Visit: Payer: Medicaid Other | Attending: Internal Medicine

## 2020-01-04 DIAGNOSIS — Z23 Encounter for immunization: Secondary | ICD-10-CM

## 2020-01-04 NOTE — Progress Notes (Signed)
   Covid-19 Vaccination Clinic  Name:  Ariel Allen    MRN: [id number] DOB: 03-04-1986  01/04/2020  Ms. Ariel Allen was observed post Covid-19 immunization for 15 minutes without incident. She was provided with Vaccine Information Sheet and instruction to access the V-Safe system.   Ms. Ariel Allen was instructed to call 911 with any severe reactions post vaccine: Marland Kitchen Difficulty breathing  . Swelling of face and throat  . A fast heartbeat  . A bad rash all over body  . Dizziness and weakness   Immunizations Administered    Name Date Dose VIS Date Route   Pfizer COVID-19 Vaccine 01/04/2020 10:26 AM 0.3 mL 09/26/2019 Intramuscular   Manufacturer: [employer], [employer]   Lot: [id number]   NDC: [address]-8

## 2020-01-25 ENCOUNTER — Ambulatory Visit: Payer: Medicaid Other | Attending: Internal Medicine

## 2020-01-25 DIAGNOSIS — Z23 Encounter for immunization: Secondary | ICD-10-CM

## 2020-01-25 NOTE — Progress Notes (Signed)
   Covid-19 Vaccination Clinic  Name:  Ariel Allen    MRN: [id number] DOB: 1985-12-17  01/25/2020  Ms. Ariel Allen was observed post Covid-19 immunization for 15 minutes without incident. She was provided with Vaccine Information Sheet and instruction to access the V-Safe system.   Ms. Ariel Allen was instructed to call 911 with any severe reactions post vaccine: Marland Kitchen Difficulty breathing  . Swelling of face and throat  . A fast heartbeat  . A bad rash all over body  . Dizziness and weakness   Immunizations Administered    Name Date Dose VIS Date Route   Pfizer COVID-19 Vaccine 01/25/2020 10:06 AM 0.3 mL 09/26/2019 Intramuscular   Manufacturer: [employer], [employer]   Lot: [id number]   NDC: [address]-0

## 2023-05-13 ENCOUNTER — Other Ambulatory Visit: Payer: Self-pay

## 2023-05-13 ENCOUNTER — Encounter: Payer: Self-pay | Admitting: *Deleted

## 2023-05-13 DIAGNOSIS — E876 Hypokalemia: Secondary | ICD-10-CM | POA: Insufficient documentation

## 2023-05-13 DIAGNOSIS — R55 Syncope and collapse: Secondary | ICD-10-CM | POA: Insufficient documentation

## 2023-05-13 NOTE — ED Triage Notes (Signed)
Pt reports she was at a visitation for her husband today and she said she was in shock, feels like her blood pressure when up and let her body go to the ground and she passed out. Denies pain, sob. She reports feeling dizziness now.

## 2023-05-13 NOTE — ED Triage Notes (Signed)
Pt arrives via [facility] from [facility]. Per report, she had panic attack, passed out. On EMS arrival, she was awake, sitting in w/c. Initial bp 190/140 hr 120. During transport hr 99, bp 170/110. IV established in the right AC.

## 2023-05-14 ENCOUNTER — Emergency Department: Payer: Medicaid Other

## 2023-05-14 ENCOUNTER — Emergency Department
Admission: EM | Admit: 2023-05-14 | Discharge: 2023-05-14 | Disposition: A | Discharge status: Home / Self Care | Payer: Self-pay | Attending: Emergency Medicine | Admitting: Emergency Medicine

## 2023-05-14 DIAGNOSIS — R55 Syncope and collapse: Secondary | ICD-10-CM

## 2023-05-14 DIAGNOSIS — E876 Hypokalemia: Secondary | ICD-10-CM

## 2023-05-14 LAB — TROPONIN I (HIGH SENSITIVITY): Troponin I (High Sensitivity): 3 ng/L (ref <18)

## 2023-05-14 MED ORDER — POTASSIUM CHLORIDE CRYS ER 20 MEQ PO TBCR
40.0000 meq | EXTENDED_RELEASE_TABLET | Freq: Once | ORAL | Status: AC
  Administered 2023-05-14: 40 meq via ORAL
  Filled 2023-05-14: qty 2

## 2023-05-14 MED ORDER — HYDROXYZINE HCL 50 MG PO TABS: 50.0000 mg | ORAL_TABLET | Freq: Three times a day (TID) | ORAL | 0 refills | Status: DC | PRN

## 2023-05-14 MED ORDER — HYDROXYZINE HCL 50 MG PO TABS: 50.0000 mg | ORAL_TABLET | Freq: Three times a day (TID) | ORAL | 0 refills | Status: AC | PRN

## 2023-05-14 MED ORDER — SODIUM CHLORIDE 0.9 % IV BOLUS
1000.0000 mL | Freq: Once | INTRAVENOUS | Status: AC
  Administered 2023-05-14: 1000 mL via INTRAVENOUS

## 2023-05-14 NOTE — ED Provider Notes (Signed)
[facility] Provider Note    Event Date/Time   First MD Initiated Contact with Patient 05/14/23 [phone]     (approximate)   History   Hypertension   HPI  Ariel Allen is a 37 y.o. female presenting to the emergency department for evaluation following a syncopal episode.  Patient's husband unexpectedly died a few days ago.  Earlier today patient began to feel anxious and in shock as she was walking into the funeral home.  She had a witnessed syncopal episode.  Does report that she has had some poor p.o. intake the last few days due to decreased appetite.  Denies nausea, vomiting, abdominal pain, diarrhea.  No chest pain or shortness of breath.  Reports initially in triage had some lightheadedness that is now resolved.      Physical Exam   Triage Vital Signs: ED Triage Vitals [05/13/23 2357]  Encounter Vitals Group     BP (!) 163/117     Systolic BP Percentile      Diastolic BP Percentile      Pulse Rate 95     Resp 16     Temp 98.5 F (36.9 C)     Temp Source Oral     SpO2 99 %     Weight      Height      Head Circumference      Peak Flow      Pain Score 0     Pain Loc      Pain Education      Exclude from Growth Chart     Most recent vital signs: Vitals:   05/14/23 0330 05/14/23 0430  BP: (!) 166/109 (!) 164/94  Pulse: 72 70  Resp:    Temp:    SpO2: 100% 99%     General: Awake, interactive  CV:  Regular rate, good peripheral perfusion.  Resp:  Lungs clear, unlabored respirations.  Abd:  Soft, nondistended.  Neuro:  Symmetric facial movement, fluid speech, 5/5 strength in bilateral upper and lower extremities, normal sensation, normal coordination   ED Results / Procedures / Treatments   Labs (all labs ordered are listed, but only abnormal results are displayed) Labs Reviewed  BASIC METABOLIC PANEL - Abnormal; Notable for the following components:      Result Value   Potassium 3.0 (*)    CO2 16 (*)     Creatinine, Ser 1.60 (*)    Calcium 8.6 (*)    GFR, Estimated 42 (*)    All other components within normal limits  CBC  POC URINE PREG, ED  TROPONIN I (HIGH SENSITIVITY)  TROPONIN I (HIGH SENSITIVITY)     EKG EKG independently reviewed interpreted by myself (ER attending) demonstrates:  EKG demonstrates normal sinus rhythm rate of 97, PR 142, QRS 94, QTc 474, no acute ST changes  RADIOLOGY Imaging independently reviewed and interpreted by myself demonstrates:  CXR without focal consolidation or pneumothorax  PROCEDURES:  Critical Care performed: No  Procedures   MEDICATIONS ORDERED IN ED: Medications  potassium chloride SA (KLOR-CON M) CR tablet 40 mEq (has no administration in time range)  sodium chloride 0.9 % bolus 1,000 mL (1,000 mLs Intravenous New Bag/Given 05/14/23 0424)     IMPRESSION / MDM / ASSESSMENT AND PLAN / ED COURSE  I reviewed the triage vital signs and the nursing notes.  Differential diagnosis includes, but is not limited to, syncopal episode secondary to significant acute stress, arrhythmia, ectopic pregnancy, electrolyte abnormality  Patient's presentation is most consistent with acute presentation with potential threat to life or bodily function.  37 year old female presenting to the emergency department following a syncopal episode.  Well-appearing here without focal neurologic deficits.  Clinical history suggestive of syncope in the setting of acute grief response.  Labs with elevated creatinine at 1.60 without recent prior for comparison and low potassium, suspect secondary to poor p.o. intake over the past few days.  She is given a liter IV fluid and oral potassium repletion.  Remainder of workup reassuring.  Patient does feel comfortable with discharge home.  Will be with family for support.  Do think she is stable for discharge.  Strict return precautions provided.  Patient discharged stable condition.      FINAL CLINICAL IMPRESSION(S) / ED  DIAGNOSES   Final diagnoses:  Syncope and collapse  Hypokalemia     Rx / DC Orders   ED Discharge Orders     None        Note:  This document was prepared using Dragon voice recognition software and may include unintentional dictation errors.   Trinna Post, MD 05/14/23 [phone]

## 2023-05-14 NOTE — Discharge Instructions (Addendum)
You were seen in the emergency department after passing out (syncope). Please arrange follow-up with a primary care provider in the next few days for further evaluation of your symptoms.  I sent a prescription for an as needed medication for anxiety that you can take for the next few days.  Return to the ER immediately if you develop chest pain, shortness of breath, it feels like your heart is racing, repeated episodes, or other new or concerning symptoms.
